# Patient Record
Sex: Female | Born: 2005 | Race: Black or African American | Hispanic: No | Marital: Single | Smoking: Never smoker
Health system: Southern US, Community
[De-identification: ages and names within clinical notes are randomized; demographics above are authoritative.]

## PROBLEM LIST (undated history)

## (undated) DIAGNOSIS — H52209 Unspecified astigmatism, unspecified eye: Secondary | ICD-10-CM

## (undated) DIAGNOSIS — K5900 Constipation, unspecified: Secondary | ICD-10-CM

## (undated) DIAGNOSIS — J309 Allergic rhinitis, unspecified: Secondary | ICD-10-CM

## (undated) DIAGNOSIS — F909 Attention-deficit hyperactivity disorder, unspecified type: Secondary | ICD-10-CM

## (undated) DIAGNOSIS — F800 Phonological disorder: Secondary | ICD-10-CM

## (undated) DIAGNOSIS — E669 Obesity, unspecified: Secondary | ICD-10-CM

## (undated) HISTORY — DX: Phonological disorder: F80.0

## (undated) HISTORY — PX: UMBILICAL HERNIA REPAIR: SHX196

## (undated) HISTORY — DX: Attention-deficit hyperactivity disorder, unspecified type: F90.9

## (undated) HISTORY — DX: Allergic rhinitis, unspecified: J30.9

## (undated) HISTORY — DX: Unspecified astigmatism, unspecified eye: H52.209

## (undated) HISTORY — DX: Constipation, unspecified: K59.00

---
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
## 2024-01-13 ENCOUNTER — Ambulatory Visit (INDEPENDENT_AMBULATORY_CARE_PROVIDER_SITE_OTHER)

## 2024-01-13 DIAGNOSIS — J309 Allergic rhinitis, unspecified: Secondary | ICD-10-CM | POA: Diagnosis not present

## 2024-02-02 ENCOUNTER — Other Ambulatory Visit: Payer: Self-pay

## 2024-02-02 ENCOUNTER — Encounter: Payer: Self-pay | Admitting: Obstetrics and Gynecology

## 2024-02-02 ENCOUNTER — Ambulatory Visit: Payer: Self-pay | Admitting: Obstetrics and Gynecology

## 2024-02-02 ENCOUNTER — Encounter: Payer: Self-pay | Admitting: Family Medicine

## 2024-02-02 ENCOUNTER — Encounter: Payer: Self-pay | Admitting: Allergy & Immunology

## 2024-02-02 ENCOUNTER — Ambulatory Visit (INDEPENDENT_AMBULATORY_CARE_PROVIDER_SITE_OTHER)

## 2024-02-02 VITALS — BP 122/85 | HR 69 | Wt 174.8 lb

## 2024-02-02 DIAGNOSIS — N910 Primary amenorrhea: Secondary | ICD-10-CM | POA: Diagnosis not present

## 2024-02-02 DIAGNOSIS — Z1331 Encounter for screening for depression: Secondary | ICD-10-CM | POA: Diagnosis not present

## 2024-02-02 DIAGNOSIS — J309 Allergic rhinitis, unspecified: Secondary | ICD-10-CM | POA: Diagnosis not present

## 2024-02-02 NOTE — Progress Notes (Signed)
 GYNECOLOGY OFFICE NOTE  History:  18 y.o. G0P0000 here today for has never had a period. Does occasionally get cramping. Occasionally has vaginal odor. Denies itching/burning/abnormal discharge. Has not had any kind of workup for amenorrhea. Not and has never been sexually active. Has never looked at groin area with mirror.  H/o FAS, ADHD.  Past Medical History:  Diagnosis Date   ADHD    Allergic rhinitis    seasonal,   Articulation disorder    Astigmatism    Constipated    Obesity     Past Surgical History:  Procedure Laterality Date   ADENOIDECTOMY  2016   TOOTH EXTRACTION N/A 03/03/2023   Procedure: DENTAL RESTORATION/EXTRACTIONS;  Surgeon: Ascencion Lava, DMD;  Location: [facility] OR;  Service: Oral Surgery;  Laterality: N/A;   UMBILICAL HERNIA REPAIR     as a baby     Current Outpatient Medications:    Azelastine -Fluticasone  (DYMISTA ) 137-50 MCG/ACT SUSP, Place 1 spray into the nose 2 (two) times daily as needed (runny or stuffy nose)., Disp: 23 g, Rfl: 5   EPINEPHrine  0.3 mg/0.3 mL IJ SOAJ injection, Inject 0.3 mg into the muscle as needed for anaphylaxis., Disp: 2 each, Rfl: 2   hydrOXYzine  (ATARAX ) 25 MG tablet, Take 25 mg by mouth at bedtime as needed., Disp: , Rfl:    levocetirizine (XYZAL ) 5 MG tablet, Take 1 tablet (5 mg total) by mouth daily as needed for allergies., Disp: 30 tablet, Rfl: 5   lisdexamfetamine (VYVANSE ) 40 MG capsule, Take 1 capsule (40 mg total) by mouth daily., Disp: 30 capsule, Rfl: 0   QELBREE 150 MG 24 hr capsule, Take 300 mg by mouth every morning., Disp: , Rfl:    lamoTRIgine  (LAMICTAL ) 25 MG tablet, Take 1 tablet (25 mg total) by mouth 2 (two) times daily. (Patient not taking: Reported on 02/02/2024), Disp: 60 tablet, Rfl: 0  The following portions of the patient's history were reviewed and updated as appropriate: allergies, current medications, past family history, past medical history, past social history, past surgical history and problem list.    Review of Systems:  Pertinent items noted in HPI and remainder of comprehensive ROS otherwise negative.   Objective:  Physical Exam BP 122/85   Pulse 69   Wt 174 lb 12.8 oz (79.3 kg)  CONSTITUTIONAL: Well-developed, well-nourished female in no acute distress.  HENT:  Normocephalic, atraumatic. External right and left ear normal. Oropharynx is clear and moist EYES: Conjunctivae and EOM are normal. Pupils are equal, round, and reactive to light. No scleral icterus.  NECK: Normal range of motion, supple, no masses SKIN: Skin is warm and dry. No rash noted. Not diaphoretic. No erythema. No pallor. NEUROLOGIC: Alert and oriented to person, place, and time. Normal reflexes, muscle tone coordination. No cranial nerve deficit noted. PSYCHIATRIC: Normal mood and affect. Normal behavior. Normal judgment and thought content. CARDIOVASCULAR: Normal heart rate noted BREAST: normal bilateral breast development, Tanner stage 5 RESPIRATORY: Effort normal, no problems with respiration noted ABDOMEN: Soft, no distention noted.   PELVIC: appearance of normal groin hair distribution though it is shaven, tanner stage 4 or 5, external female genitalia present, labia minora small, consistent with teenager, mildly enlarged clitoris, approx 0.5 cm wide and 1.5 cm deep, normal appearing introitus, patient declined internal exam MUSCULOSKELETAL: Normal range of motion. No edema noted.  Exam done with chaperone present.  Labs and Imaging No results found.  Assessment & Plan:   1. Amenorrhea, primary (Primary) - reviewed findings, (eg normal breast  development, possible clitoromegaly although may be within larger end of normal range, normal hair development/distribution) -reviewed there are multiple potential possibilities including hormonal imbalance, enzyme deficiency, structural issues, need for workup to start with most common and rule out, then will proceed pending results - pt verbalizes understanding  of the above - Beta hCG quant (ref lab) - FSH - Prolactin - TSH Rfx on Abnormal to Free T4 - US  PELVIS (TRANSABDOMINAL ONLY); Future   Routine preventative health maintenance measures emphasized. Please refer to After Visit Summary for other counseling recommendations.   Return in about 1 month (around 03/04/2024).   Larence Pleas, MD, [facility] Attending Center for [facility])

## 2024-02-03 LAB — FOLLICLE STIMULATING HORMONE: FSH: 116 m[IU]/mL

## 2024-02-03 LAB — TSH RFX ON ABNORMAL TO FREE T4: TSH: 1.73 u[IU]/mL (ref 0.450–4.500)

## 2024-02-03 LAB — BETA HCG QUANT (REF LAB): hCG Quant: <1 m[IU]/mL

## 2024-02-03 LAB — PROLACTIN: Prolactin: 6.4 ng/mL (ref 4.8–33.4)

## 2024-02-05 ENCOUNTER — Ambulatory Visit: Payer: Self-pay | Admitting: Family Medicine

## 2024-02-10 ENCOUNTER — Ambulatory Visit (HOSPITAL_COMMUNITY): Admission: RE | Admit: 2024-02-10 | Source: Ambulatory Visit

## 2024-02-17 ENCOUNTER — Other Ambulatory Visit: Payer: Self-pay | Admitting: *Deleted

## 2024-02-17 ENCOUNTER — Encounter: Payer: Self-pay | Admitting: *Deleted

## 2024-02-17 DIAGNOSIS — N910 Primary amenorrhea: Secondary | ICD-10-CM

## 2024-02-17 DIAGNOSIS — R7989 Other specified abnormal findings of blood chemistry: Secondary | ICD-10-CM

## 2024-02-22 ENCOUNTER — Other Ambulatory Visit: Payer: Self-pay

## 2024-02-22 ENCOUNTER — Ambulatory Visit (HOSPITAL_COMMUNITY)
Admission: RE | Admit: 2024-02-22 | Discharge: 2024-02-22 | Disposition: A | Discharge status: Home / Self Care | Source: Ambulatory Visit | Attending: Obstetrics and Gynecology | Admitting: Obstetrics and Gynecology

## 2024-02-22 DIAGNOSIS — N910 Primary amenorrhea: Secondary | ICD-10-CM

## 2024-02-22 DIAGNOSIS — R7989 Other specified abnormal findings of blood chemistry: Secondary | ICD-10-CM

## 2024-02-26 ENCOUNTER — Ambulatory Visit (INDEPENDENT_AMBULATORY_CARE_PROVIDER_SITE_OTHER): Payer: Self-pay

## 2024-02-26 DIAGNOSIS — J309 Allergic rhinitis, unspecified: Secondary | ICD-10-CM | POA: Diagnosis not present

## 2024-03-03 ENCOUNTER — Ambulatory Visit (INDEPENDENT_AMBULATORY_CARE_PROVIDER_SITE_OTHER): Payer: Self-pay

## 2024-03-03 DIAGNOSIS — J309 Allergic rhinitis, unspecified: Secondary | ICD-10-CM | POA: Diagnosis not present

## 2024-03-09 ENCOUNTER — Encounter: Payer: Self-pay | Admitting: Allergy

## 2024-03-09 ENCOUNTER — Ambulatory Visit (INDEPENDENT_AMBULATORY_CARE_PROVIDER_SITE_OTHER): Payer: Medicaid Other | Admitting: Allergy

## 2024-03-09 DIAGNOSIS — J3089 Other allergic rhinitis: Secondary | ICD-10-CM | POA: Diagnosis not present

## 2024-03-09 NOTE — Progress Notes (Signed)
 Follow-up Note  RE: Erin Wright MRN: [id number] DOB: [birth year] Date of Office Visit: 03/09/2024   History of present illness: Erin Wright is a 18 y.o. female presenting today skin testing visit.  She was last seen in the office on 09/06/24 for allergic rhinitis.  She is in her usual state of health today without recent illness.  She has held antihistamines for at least 3 days for testing today.   Medication List: Current Outpatient Medications  Medication Sig Dispense Refill   Azelastine -Fluticasone  (DYMISTA ) 137-50 MCG/ACT SUSP Place 1 spray into the nose 2 (two) times daily as needed (runny or stuffy nose). 23 g 5   EPINEPHrine  0.3 mg/0.3 mL IJ SOAJ injection Inject 0.3 mg into the muscle as needed for anaphylaxis. 2 each 2   hydrOXYzine  (ATARAX ) 25 MG tablet Take 25 mg by mouth at bedtime as needed.     lamoTRIgine  (LAMICTAL ) 25 MG tablet Take 1 tablet (25 mg total) by mouth 2 (two) times daily. (Patient not taking: Reported on 02/02/2024) 60 tablet 0   levocetirizine (XYZAL ) 5 MG tablet Take 1 tablet (5 mg total) by mouth daily as needed for allergies. 30 tablet 5   lisdexamfetamine (VYVANSE ) 40 MG capsule Take 1 capsule (40 mg total) by mouth daily. 30 capsule 0   QELBREE 150 MG 24 hr capsule Take 300 mg by mouth every morning.     No current facility-administered medications for this visit.     Known medication allergies: No Known Allergies  Diagnostics/Labs:  Allergy  testing:   Airborne Adult Perc - 03/09/24 1453     Time Antigen Placed 1453    Allergen Manufacturer Jestine    Location Back    Number of Test 55    Panel 1 Select    1. Control-Buffer 50% Glycerol Negative    2. Control-Histamine 2+    3. Bahia Negative    4. [place] Territ[place] Negative    5. Johnson Negative    6. [place]  Blue Negative    7. Meadow Fescue 2+    8. Perennial Rye Negative    9. Timothy Negative    10. Ragweed Mix Negative    11. Cocklebur 2+    12. Plantain,  English Negative     13. Baccharis Negative    14. Dog Fennel Negative    15. [place] Thistle 2+    16. Lamb's Quarters Negative    17. Sheep Sorrell Negative    18. Rough Pigweed Negative    19. Marsh Elder, Rough 2+    20. Mugwort, Common Negative    21. Box, Elder Negative    22. Cedar, red Negative    23. Sweet Gum 2+    24. Pecan Pollen 2+    25. Pine Mix Negative    26. Walnut, Black Pollen Negative    27. Red Mulberry 2+    28. Ash Mix 2+    29. Birch Mix Negative    30. Beech American Negative    31. Cottonwood, [place] Negative    32. Hickory, White 3+    33. Maple Mix Negative    34. Oak, [place] Mix Negative    35. Sycamore Eastern Negative    36. Alternaria Alternata Negative    37. Cladosporium Herbarum Negative    38. Aspergillus Mix Negative    39. Penicillium Mix Negative    40. Bipolaris Sorokiniana (Helminthosporium) Negative    41. Drechslera Spicifera (Curvularia) Negative    42. Mucor Plumbeus Negative  43. Fusarium Moniliforme Negative    44. Aureobasidium Pullulans (pullulara) Negative    45. Rhizopus Oryzae Negative    46. Botrytis Cinera Negative    47. Epicoccum Nigrum Negative    48. Phoma Betae Negative    49. Dust Mite Mix Negative    50. Cat Hair 10,000 BAU/ml Negative    51.  Dog Epithelia Negative    52. Mixed Feathers Negative    53. Horse Epithelia Negative    54. Cockroach, [place] Negative          Allergy  testing results were read and interpreted by provider, documented by clinical staff.   Assessment and plan:   Allergic rhinitis   - Continue avoidance measures for dust mites, cat, dog, grasses, trees, weeds, molds, cockroach.       - Rotate every 6 months between Cetirizine  and Levocetirizine.     Make sure to take at least 30 minutes prior to your allergy  shots.     - for nasal congestion (stuffy nose) or drainage (runny nose) use Dymista  spray 1 spray each nostril twice a day as needed.   - allergen immunotherapy (allergy  shots) at 4  week maintenance dosing.  Bring your Epipen  on your allergy  shot days.  - Testing today showed: grasses, weeds, and trees.  These levels are much lower/smaller than they were in 2019 testing.  You have lost reactivity to molds, dust mites, cat, dog and cockroach!  Thus you no longer need shots from the second vial containing molds, dust mites and cockroach thus will discontinue this vial.  Will plan to continue your current pollen/pet vial and once it has run out will remake with just pollens only to complete and then will be done with allergy  shots! - Copy of test results provided.  - Avoidance measures provided.  Follow-up 6 months and plan to skin test at this visit   I appreciate the opportunity to take part in Erin Wright's care. Please do not hesitate to contact me with questions.  Sincerely,   Danita Brain, MD Allergy /Immunology [facility]

## 2024-03-09 NOTE — Patient Instructions (Addendum)
 Allergic rhinitis   - Continue avoidance measures for dust mites, cat, dog, grasses, trees, weeds, molds, cockroach.       - Rotate every 6 months between Cetirizine  and Levocetirizine.     Make sure to take at least 30 minutes prior to your allergy  shots.     - for nasal congestion (stuffy nose) or drainage (runny nose) use Dymista  spray 1 spray each nostril twice a day as needed.   - allergen immunotherapy (allergy  shots) at 4 week maintenance dosing.  Bring your Epipen  on your allergy  shot days.  - Testing today showed: grasses, weeds, and trees.  These levels are much lower/smaller than they were in 2019 testing.  You have lost reactivity to molds, dust mites, cat, dog and cockroach!  Thus you no longer need shots from the second vial containing molds, dust mites and cockroach thus will discontinue this vial.  Will plan to continue your current pollen/pet vial and once it has run out will remake with just pollens only to complete and then will be done with allergy  shots! - Copy of test results provided.  - Avoidance measures provided.  Follow-up 6 months and plan to skin test at this visit

## 2024-03-10 LAB — CHROMOSOME, BLOOD, ROUTINE
Cells Analyzed: 20
Cells Counted: 30
Cells Karyotyped: 2
GTG Band Resolution Achieved: 500

## 2024-03-10 NOTE — Addendum Note (Signed)
 Addended by: MARCINE ISAIAH CROME on: 03/10/2024 11:31 AM   Modules accepted: Orders

## 2024-03-11 ENCOUNTER — Ambulatory Visit: Payer: Self-pay | Admitting: Obstetrics and Gynecology

## 2024-03-22 ENCOUNTER — Ambulatory Visit (INDEPENDENT_AMBULATORY_CARE_PROVIDER_SITE_OTHER)

## 2024-03-22 DIAGNOSIS — J309 Allergic rhinitis, unspecified: Secondary | ICD-10-CM | POA: Diagnosis not present

## 2024-03-23 ENCOUNTER — Encounter: Payer: Self-pay | Admitting: Obstetrics and Gynecology

## 2024-03-23 ENCOUNTER — Other Ambulatory Visit: Payer: Self-pay

## 2024-03-23 ENCOUNTER — Ambulatory Visit (INDEPENDENT_AMBULATORY_CARE_PROVIDER_SITE_OTHER): Payer: Self-pay | Admitting: Obstetrics and Gynecology

## 2024-03-23 VITALS — BP 127/89 | HR 56 | Wt 187.0 lb

## 2024-03-23 DIAGNOSIS — E2839 Other primary ovarian failure: Secondary | ICD-10-CM

## 2024-03-23 DIAGNOSIS — Z1331 Encounter for screening for depression: Secondary | ICD-10-CM | POA: Diagnosis not present

## 2024-03-23 MED ORDER — NORETHIN ACE-ETH ESTRAD-FE 1-20 MG-MCG(24) PO TABS: 1.0000 | ORAL_TABLET | Freq: Every day | ORAL | 11 refills | Status: DC

## 2024-03-23 NOTE — Progress Notes (Signed)
 GYNECOLOGY OFFICE FOLLOW UP NOTE  History:  18 y.o. G0P0000 here today for follow up for primary amenorrhea. Patient continues to have no periods. Here with mom. 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    Past Medical History:  Diagnosis Date   ADHD    Allergic rhinitis    seasonal,   Articulation disorder    Astigmatism    Constipated    Obesity     Past Surgical History:  Procedure Laterality Date   ADENOIDECTOMY  2016   TOOTH EXTRACTION N/A 03/03/2023   Procedure: DENTAL RESTORATION/EXTRACTIONS;  Surgeon: Sheryle Hamilton, DMD;  Location: [facility] OR;  Service: Oral Surgery;  Laterality: N/A;   UMBILICAL HERNIA REPAIR     as a baby     Current Outpatient Medications:    Azelastine -Fluticasone  (DYMISTA ) 137-50 MCG/ACT SUSP, Place 1 spray into the nose 2 (two) times daily as needed (runny or stuffy nose)., Disp: 23 g, Rfl: 5   hydrOXYzine  (ATARAX ) 25 MG tablet, Take 25 mg by mouth at bedtime as needed., Disp: , Rfl:    lamoTRIgine  (LAMICTAL ) 25 MG tablet, Take 1 tablet (25 mg total) by mouth 2 (two) times daily., Disp: 60 tablet, Rfl: 0   levocetirizine (XYZAL ) 5 MG tablet, Take 1 tablet (5 mg total) by mouth daily as needed for allergies., Disp: 30 tablet, Rfl: 5   lisdexamfetamine (VYVANSE ) 40 MG capsule, Take 1 capsule (40 mg total) by mouth daily., Disp: 30 capsule, Rfl: 0   Norethindrone Acetate-Ethinyl Estrad-FE (LOESTRIN 24 FE) 1-20 MG-MCG(24) tablet, Take 1 tablet by mouth daily., Disp: 28 tablet, Rfl: 11   QELBREE 150 MG 24 hr capsule, Take 300 mg by mouth every morning., Disp: , Rfl:    EPINEPHrine  0.3 mg/0.3 mL IJ SOAJ injection, Inject 0.3 mg into the muscle as needed for anaphylaxis. (Patient not taking: Reported on 03/23/2024), Disp: 2 each, Rfl: 2  The following portions of the patient's history were reviewed and updated as appropriate: allergies, current medications, past family history, past medical history, past social history, past surgical history and problem list.    Review of Systems:  Pertinent items noted in HPI and remainder of comprehensive ROS otherwise negative.   Objective:  Physical Exam BP 127/89   Pulse (!) 56   Wt 187 lb (84.8 kg)  CONSTITUTIONAL: Well-developed, well-nourished female in no acute distress.  HENT:  Normocephalic, atraumatic. External right and left ear normal. Oropharynx is clear and moist EYES: Conjunctivae and EOM are normal. Pupils are equal, round, and reactive to light. No scleral icterus.  NECK: Normal range of motion, supple, no masses SKIN: Skin is warm and dry. No rash noted. Not diaphoretic. No erythema. No pallor. NEUROLOGIC: Alert and oriented to person, place, and time. Normal reflexes, muscle tone coordination. No cranial nerve deficit noted. PSYCHIATRIC: Normal mood and affect. Normal behavior. Normal judgment and thought content. CARDIOVASCULAR: Normal heart rate noted RESPIRATORY: Effort normal, no problems with respiration noted ABDOMEN: Soft, no distention noted.   PELVIC: deferred MUSCULOSKELETAL: Normal range of motion. No edema noted.  Labs and Imaging US  PELVIS (TRANSABDOMINAL ONLY) Result Date: 02/22/2024 CLINICAL DATA:  Primary amenorrhea EXAM: TRANSABDOMINAL ULTRASOUND OF PELVIS TECHNIQUE: Transabdominal ultrasound examination of the pelvis was performed including evaluation of the uterus, ovaries, adnexal regions, and pelvic cul-de-sac. COMPARISON:  None Available. FINDINGS: Uterus Measurements: 5.0 cm in sagittal dimension. No fibroids or other mass visualized. Endometrium Endometrium is not well seen. Right ovary Not seen. Left ovary Not seen. Other findings:  No abnormal free  fluid. IMPRESSION: 1. Limited transabdominal pelvic ultrasound examination due to suboptimal bladder distension. Endometrium and ovaries are not well seen. 2. Uterine length in sagittal dimension measures at the lower limits of normal. Electronically Signed   By: Limin  Xu M.D.   On: 02/22/2024 16:17    Assessment & Plan:   1. Primary ovarian insufficiency (Primary) - Reviewed findings of testing thus far - 59 XX karyotype, pelvic US  with uterus noted though not well visualized, ovaries not visualized - dx with primary ovarian insufficiency, reviewed etiology and course, management and prognosis, reviewed low likelihood of achieving pregnancy though 5-10% of people will be able to get pregnant and ovarian function not certain and potentially variable, reviewed further testing warranted to try to ascertain cause for POI but may not find etiology, reviewed need for hormonal management for protection of bones/CV, prevent osteoporosis and CVD as well as breast cancer - reviewed options for hormone replacement management and contraception if/when needed (she is not currently sexually active). Mom is interested in an IUD for her, however reviewed with patient and she is reluctant at this time, will plan to start OCPs for hormonal management/replacement at this time and consider IUD at some point in future, patient agreeable to this plan - reviewed importance of lifelong management needed - answered all questions, patient and mom verbalize they understand dx and plan - FSH - Estradiol - 21-Hydroxylase Antibodies - Fragile X Syndrome, Diagnostic - Cortisol-am, blood - DHEA-sulfate - Thyroid  peroxidase antibody - 17-Hydroxyprogesterone - ANTI-ADRENAL AB BY INDIRECT FLUORESCENT ANTIBODY(IFA)(RDL)   Routine preventative health maintenance measures emphasized. Please refer to After Visit Summary for other counseling recommendations.   Return in about 3 months (around 06/23/2024) for Followup.   LOIS Yolanda Moats, MD, [facility] Attending Center for [employer] [facility])

## 2024-03-25 LAB — 17-HYDROXYPROGESTERONE: 17-OH Progesterone LCMS: 12 ng/dL

## 2024-04-02 LAB — 21-HYDROXYLASE ANTIBODIES: 21-Hydroxylase Antibodies: NEGATIVE

## 2024-04-05 ENCOUNTER — Ambulatory Visit: Payer: Self-pay | Admitting: Obstetrics and Gynecology

## 2024-04-05 LAB — CORTISOL-AM, BLOOD: Cortisol - AM: 8.1 ug/dL (ref 6.2–19.4)

## 2024-04-05 LAB — FRAGILE X SYNDROME, DIAGNOSTIC

## 2024-04-05 LAB — FOLLICLE STIMULATING HORMONE: FSH: 99.9 m[IU]/mL

## 2024-04-05 LAB — ESTRADIOL: Estradiol: <5 pg/mL

## 2024-04-05 LAB — DHEA-SULFATE: DHEA-SO4: 323 ug/dL (ref 110.0–433.2)

## 2024-04-05 LAB — THYROID PEROXIDASE ANTIBODY: Thyroperoxidase Ab SerPl-aCnc: 123 [IU]/mL — ABNORMAL HIGH (ref 0–26)

## 2024-05-23 ENCOUNTER — Other Ambulatory Visit (HOSPITAL_COMMUNITY): Payer: Self-pay

## 2024-05-31 ENCOUNTER — Ambulatory Visit: Payer: Self-pay | Admitting: Obstetrics and Gynecology

## 2024-05-31 ENCOUNTER — Encounter: Payer: Self-pay | Admitting: Obstetrics and Gynecology

## 2024-05-31 ENCOUNTER — Other Ambulatory Visit: Payer: Self-pay

## 2024-05-31 VITALS — BP 138/92 | HR 52 | Wt 200.0 lb

## 2024-05-31 DIAGNOSIS — E2839 Other primary ovarian failure: Secondary | ICD-10-CM

## 2024-05-31 DIAGNOSIS — N910 Primary amenorrhea: Secondary | ICD-10-CM | POA: Diagnosis not present

## 2024-05-31 DIAGNOSIS — Z1331 Encounter for screening for depression: Secondary | ICD-10-CM | POA: Diagnosis not present

## 2024-05-31 MED ORDER — NORETHIN ACE-ETH ESTRAD-FE 1-20 MG-MCG(24) PO TABS: 1.0000 | ORAL_TABLET | Freq: Every day | ORAL | 11 refills | Status: AC

## 2024-05-31 NOTE — Progress Notes (Signed)
 GYNECOLOGY OFFICE FOLLOW UP NOTE  History:  18 y.o. G0P0000 here today for follow up for primary amenorrhea. Patient continues to have no periods. Has been pretty good about taking her OCPs, states she rarely misses them. Not sexually active. No questions about diagnosis. Has gained some weight but states that happens when she doesn't take her ADHD pills.   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     Past Medical History:  Diagnosis Date   ADHD    Allergic rhinitis    seasonal,   Articulation disorder    Astigmatism    Constipated    Obesity     Past Surgical History:  Procedure Laterality Date   ADENOIDECTOMY  2016   TOOTH EXTRACTION N/A 03/03/2023   Procedure: DENTAL RESTORATION/EXTRACTIONS;  Surgeon: Sheryle Hamilton, DMD;  Location: [facility] OR;  Service: Oral Surgery;  Laterality: N/A;   UMBILICAL HERNIA REPAIR     as a baby     Current Outpatient Medications:    hydrOXYzine  (ATARAX ) 25 MG tablet, Take 25 mg by mouth at bedtime as needed., Disp: , Rfl:    lamoTRIgine  (LAMICTAL ) 25 MG tablet, Take 1 tablet (25 mg total) by mouth 2 (two) times daily., Disp: 60 tablet, Rfl: 0   levocetirizine (XYZAL ) 5 MG tablet, Take 1 tablet (5 mg total) by mouth daily as needed for allergies., Disp: 30 tablet, Rfl: 5   QELBREE 150 MG 24 hr capsule, Take 300 mg by mouth every morning., Disp: , Rfl:    Azelastine -Fluticasone  (DYMISTA ) 137-50 MCG/ACT SUSP, Place 1 spray into the nose 2 (two) times daily as needed (runny or stuffy nose)., Disp: 23 g, Rfl: 5   EPINEPHrine  0.3 mg/0.3 mL IJ SOAJ injection, Inject 0.3 mg into the muscle as needed for anaphylaxis. (Patient not taking: Reported on 03/23/2024), Disp: 2 each, Rfl: 2   lisdexamfetamine (VYVANSE ) 40 MG capsule, Take 1 capsule (40 mg total) by mouth daily., Disp: 30 capsule, Rfl: 0   Norethindrone Acetate-Ethinyl Estrad-FE (LOESTRIN 24 FE) 1-20 MG-MCG(24) tablet, Take 1 tablet by mouth daily., Disp: 28 tablet, Rfl: 11  The following portions of the  patient's history were reviewed and updated as appropriate: allergies, current medications, past family history, past medical history, past social history, past surgical history and problem list.   Review of Systems:  Pertinent items noted in HPI and remainder of comprehensive ROS otherwise negative.   Objective:  Physical Exam BP (!) 138/92   Pulse (!) 52   Wt 200 lb (90.7 kg)  CONSTITUTIONAL: Well-developed, well-nourished female in no acute distress.  HENT:  Normocephalic, atraumatic. External right and left ear normal. Oropharynx is clear and moist EYES: Conjunctivae and EOM are normal. Pupils are equal, round, and reactive to light. No scleral icterus.  NECK: Normal range of motion, supple, no masses SKIN: Skin is warm and dry. No rash noted. Not diaphoretic. No erythema. No pallor. NEUROLOGIC: Alert and oriented to person, place, and time. Normal reflexes, muscle tone coordination. No cranial nerve deficit noted. PSYCHIATRIC: Normal mood and affect. Normal behavior. Normal judgment and thought content. CARDIOVASCULAR: Normal heart rate noted RESPIRATORY: Effort normal, no problems with respiration noted ABDOMEN: Soft, no distention noted.   PELVIC: deferred MUSCULOSKELETAL: Normal range of motion. No edema noted.  Labs and Imaging No results found.  Assessment & Plan:   1. Primary ovarian insufficiency (Primary) Remains on OCPs Reviewed importance of continuing to take OCPs for hormonal benefit, if she is missing pills regularly and sexually active, then may need  to discuss alternative method of contraception - reviewed diagnosis, she verbalizes understanding of plan and has no questions  2. Amenorrhea, primary  F/u with PCP for elevated BP at her yearly appt   Routine preventative health maintenance measures emphasized. Please refer to After Visit Summary for other counseling recommendations.   Return in about 6 months (around 11/28/2024) for Followup.   LOIS Yolanda Moats, MD, [facility] Attending Center for [employer] [facility])

## 2024-08-31 ENCOUNTER — Ambulatory Visit: Admitting: Allergy

## 2024-09-18 ENCOUNTER — Encounter: Payer: Self-pay | Admitting: Emergency Medicine

## 2024-09-18 ENCOUNTER — Emergency Department (HOSPITAL_COMMUNITY): Payer: MEDICAID

## 2024-09-18 ENCOUNTER — Encounter (HOSPITAL_COMMUNITY): Payer: Self-pay | Admitting: Pharmacy Technician

## 2024-09-18 ENCOUNTER — Emergency Department (HOSPITAL_COMMUNITY)
Admission: EM | Admit: 2024-09-18 | Discharge: 2024-09-18 | Disposition: A | Discharge status: Home / Self Care | Payer: MEDICAID | Attending: Emergency Medicine | Admitting: Emergency Medicine

## 2024-09-18 ENCOUNTER — Ambulatory Visit
Admission: EM | Admit: 2024-09-18 | Discharge: 2024-09-18 | Disposition: A | Discharge status: Other Acute Inpatient Hospital | Payer: MEDICAID | Attending: Student | Admitting: Student

## 2024-09-18 ENCOUNTER — Other Ambulatory Visit: Payer: Self-pay

## 2024-09-18 DIAGNOSIS — R1012 Left upper quadrant pain: Secondary | ICD-10-CM | POA: Diagnosis present

## 2024-09-18 DIAGNOSIS — R1011 Right upper quadrant pain: Secondary | ICD-10-CM | POA: Insufficient documentation

## 2024-09-18 DIAGNOSIS — R1085 Abdominal pain of multiple sites: Secondary | ICD-10-CM

## 2024-09-18 DIAGNOSIS — R399 Unspecified symptoms and signs involving the genitourinary system: Secondary | ICD-10-CM

## 2024-09-18 DIAGNOSIS — R519 Headache, unspecified: Secondary | ICD-10-CM | POA: Diagnosis not present

## 2024-09-18 DIAGNOSIS — R109 Unspecified abdominal pain: Secondary | ICD-10-CM

## 2024-09-18 LAB — COMPREHENSIVE METABOLIC PANEL WITH GFR
ALT: 25 U/L (ref 0–44)
AST: 25 U/L (ref 15–41)
Albumin: 5.2 g/dL — ABNORMAL HIGH (ref 3.5–5.0)
Alkaline Phosphatase: 104 U/L (ref 38–126)
Anion gap: 14 (ref 5–15)
BUN: 13 mg/dL (ref 6–20)
CO2: 22 mmol/L (ref 22–32)
Calcium: 10.7 mg/dL — ABNORMAL HIGH (ref 8.9–10.3)
Chloride: 103 mmol/L (ref 98–111)
Creatinine, Ser: 0.96 mg/dL (ref 0.44–1.00)
GFR, Estimated: >60 mL/min
Glucose, Bld: 90 mg/dL (ref 70–99)
Potassium: 4.4 mmol/L (ref 3.5–5.1)
Sodium: 139 mmol/L (ref 135–145)
Total Bilirubin: 0.5 mg/dL (ref 0.0–1.2)
Total Protein: 9.4 g/dL — ABNORMAL HIGH (ref 6.5–8.1)

## 2024-09-18 LAB — CBC
HCT: 43.9 % (ref 36.0–46.0)
Hemoglobin: 14.1 g/dL (ref 12.0–15.0)
MCH: 28.4 pg (ref 26.0–34.0)
MCHC: 32.1 g/dL (ref 30.0–36.0)
MCV: 88.5 fL (ref 80.0–100.0)
Platelets: 410 K/uL — ABNORMAL HIGH (ref 150–400)
RBC: 4.96 MIL/uL (ref 3.87–5.11)
RDW: 12.4 % (ref 11.5–15.5)
WBC: 5.7 K/uL (ref 4.0–10.5)
nRBC: 0 % (ref 0.0–0.2)

## 2024-09-18 LAB — LIPASE, BLOOD: Lipase: 20 U/L (ref 11–51)

## 2024-09-18 LAB — POCT URINE DIPSTICK
Blood, UA: NEGATIVE
Glucose, UA: NEGATIVE mg/dL
Leukocytes, UA: NEGATIVE
Nitrite, UA: NEGATIVE
POC PROTEIN,UA: 30 — AB
Spec Grav, UA: >=1.03 — AB
Urobilinogen, UA: 0.2 U/dL
pH, UA: 6

## 2024-09-18 LAB — POCT URINE PREGNANCY: Preg Test, Ur: NEGATIVE

## 2024-09-18 LAB — HCG, SERUM, QUALITATIVE: Preg, Serum: NEGATIVE

## 2024-09-18 MED ORDER — IOHEXOL 300 MG/ML  SOLN
100.0000 mL | Freq: Once | INTRAMUSCULAR | Status: AC | PRN
  Administered 2024-09-18: 100 mL via INTRAVENOUS

## 2024-09-18 MED ORDER — FENTANYL CITRATE (PF) 50 MCG/ML IJ SOSY
50.0000 ug | PREFILLED_SYRINGE | Freq: Once | INTRAMUSCULAR | Status: AC
  Administered 2024-09-18: 50 ug via INTRAVENOUS
  Filled 2024-09-18: qty 1

## 2024-09-18 NOTE — Discharge Instructions (Signed)
-  Please head to the emergency department for further evaluation of your abdominal pain. -I do not know what is causing your pain, and because this is an urgent care, I cannot do a scan of your belly to figure out the cause. -Please head straight to the ER, in the car driven by your sister.

## 2024-09-18 NOTE — ED Provider Notes (Signed)
 " [facility]    CSN: [id number] Arrival date & time: 09/18/24  1148      History   Chief Complaint Chief Complaint  Patient presents with   Abdominal Pain    HPI Erin Wright is a 19 y.o. female presenting w abd pain and flank pain.  -Pt sts generalized abd pain worse in the epigastric area, left upper, into flank x 5 days. States her stomach is bruised.  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   Receptive language disorder 01/13/2017   ADHD (attention deficit hyperactivity disorder), combined type 01/13/2017   ADHD (attention deficit hyperactivity disorder) 06/06/2013   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 Authorizing Provider  Azelastine -Fluticasone  (DYMISTA ) 137-50 MCG/ACT SUSP Place 1 spray into the nose 2 (two) times daily as needed (runny or stuffy nose). 09/07/23   Jeneal Danita Macintosh, MD   EPINEPHrine  0.3 mg/0.3 mL IJ SOAJ injection Inject 0.3 mg into the muscle as needed for anaphylaxis. Patient not taking: Reported on 03/23/2024 02/25/23   Jeneal Danita Macintosh, MD  hydrOXYzine  (ATARAX ) 25 MG tablet Take 25 mg by mouth at bedtime as needed. 08/11/23   [provider]  lamoTRIgine  (LAMICTAL ) 25 MG tablet Take 1 tablet (25 mg total) by mouth 2 (two) times daily. 06/11/23   Carrion-Carrero, Marlo, MD  levocetirizine (XYZAL ) 5 MG tablet Take 1 tablet (5 mg total) by mouth daily as needed for allergies. 09/07/23   Jeneal Danita Macintosh, MD  lisdexamfetamine  (VYVANSE ) 40 MG capsule Take 1 capsule (40 mg total) by mouth daily. 06/12/23 03/23/24  Carrion-Carrero, Marlo, MD  Norethindrone Acetate-Ethinyl Estrad-FE (LOESTRIN 24 FE) 1-20 MG-MCG(24) tablet Take 1 tablet by mouth daily. 05/31/24   Nicholaus Burnard HERO, MD  QELBREE 150 MG 24 hr capsule Take 300 mg by mouth every morning. 12/30/23   [provider]    Family History Family History  Adopted: Yes  Problem Relation Age of Onset   Diabetes Maternal Grandmother    Cancer Maternal Great-grandmother     Social History Social History[1]   Allergies   Patient has no known  allergies.   Review of Systems Review of Systems  Constitutional:  Negative for appetite change, chills, diaphoresis and fever.  Respiratory:  Negative for shortness of breath.   Cardiovascular:  Negative for chest pain.  Gastrointestinal:  Positive for abdominal pain. Negative for blood in stool, constipation, diarrhea, nausea and vomiting.  Genitourinary:  Negative for decreased urine volume, difficulty urinating, dysuria, flank pain, frequency, genital sores, hematuria and urgency.  Musculoskeletal:  Negative for back pain.  Neurological:  Negative for dizziness, weakness and light-headedness.  All other systems reviewed and are negative.    Physical Exam Triage Vital Signs ED Triage Vitals [09/18/24 1325]  Encounter Vitals  Group     BP (!) 149/97     Girls Systolic BP Percentile      Girls Diastolic BP Percentile      Boys Systolic BP Percentile      Boys Diastolic BP Percentile      Pulse Rate 85     Resp 18     Temp 99.1 F (37.3 C)     Temp Source Oral     SpO2 98 %     Weight      Height      Head Circumference      Peak Flow      Pain Score 7     Pain Loc      Pain Education      Exclude from Growth Chart    No data found.  Updated Vital Signs BP (!) 149/97 (BP Location: Right Arm)   Pulse 85   Temp 99.1 F (37.3 C) (Oral)   Resp 18   SpO2 98%   Visual Acuity Right Eye Distance:   Left Eye Distance:   Bilateral Distance:    Right Eye Near:   Left Eye Near:    Bilateral Near:     Physical Exam Vitals reviewed.  Constitutional:      General: She is not in acute distress.    Appearance: Normal appearance. She is not ill-appearing.  HENT:     Head: Normocephalic and atraumatic.     Mouth/Throat:     Mouth: Mucous membranes are moist.     Comments: Moist mucous membranes Eyes:     Extraocular Movements: Extraocular movements intact.     Pupils: Pupils are equal, round, and reactive to light.  Cardiovascular:     Rate and Rhythm: Normal rate and regular rhythm.     Heart sounds: Normal heart sounds.  Pulmonary:     Effort: Pulmonary effort is normal.     Breath sounds: Normal breath sounds. No wheezing, rhonchi or rales.  Abdominal:     General: Bowel sounds are normal. There is no distension.     Palpations: Abdomen is soft. There is no mass.     Tenderness: There is abdominal tenderness. There is no right CVA tenderness, left CVA tenderness, guarding or rebound. Negative signs include Murphy's sign and McBurney's sign.     Comments: Abdomen is diffusely tender to palpation, without guarding or rebound.  No point tenderness.  Bowel sounds positive throughout.  Skin:    General: Skin is warm.     Capillary Refill: Capillary refill takes less than 2 seconds.      Comments: Good skin turgor  Neurological:     General: No focal deficit present.     Mental Status: She is alert and oriented to person, place, and time.  Psychiatric:        Mood  and Affect: Mood normal.        Behavior: Behavior normal.      UC Treatments / Results  Labs (all labs ordered are listed, but only abnormal results are displayed) Labs Reviewed  POCT URINE DIPSTICK - Abnormal; Notable for the following components:      Result Value   Color, UA straw (*)    Bilirubin, UA small (*)    Ketones, POC UA moderate (40) (*)    Spec Grav, UA >=1.030 (*)    POC PROTEIN,UA =30 (*)    All other components within normal limits  POCT URINE PREGNANCY    EKG   Radiology No results found.  Procedures Procedures (including critical care time)  Medications Ordered in UC Medications - No data to display  Initial Impression / Assessment and Plan / UC Course  I have reviewed the triage vital signs and the nursing notes.  Pertinent labs & imaging results that were available during my care of the patient were reviewed by me and considered in my medical decision making (see chart for details).     Patient is a pleasant 19 y.o. female presenting with abdominal pain. The patient is afebrile and nontachycardic.  Antipyretic has not been administered today. OCP contraception.  UA: Negative blood, negative leuk, negative nitrite. Urine pregnancy: Negative.  The patient has never had a menstrual cycle.  DDx is extensive, given generalized abdominal pain.  We discussed that I am unable to perform abdominal imaging in the urgent care setting, and so I recommended evaluation in the emergency department.  She is stable for transport in POV driven by sister.  Final Clinical Impressions(s) / UC Diagnoses   Final diagnoses:  Urinary symptom or sign  Abdominal pain of multiple sites     Discharge Instructions      -Please head to the emergency department for further evaluation of  your abdominal pain. -I do not know what is causing your pain, and because this is an urgent care, I cannot do a scan of your belly to figure out the cause. -Please head straight to the ER, in the car driven by your sister.     ED Prescriptions   None    PDMP not reviewed this encounter.     [1]  Social History Tobacco Use   Smoking status: Never   Smokeless tobacco: Never  Vaping Use   Vaping status: Never Used  Substance Use Topics   Alcohol use: Never   Drug use: Never     Arlyss Leita BRAVO, PA-C 09/18/24 1454  "

## 2024-09-18 NOTE — ED Triage Notes (Signed)
 Pt here from UC for abdominal pain and headache.

## 2024-09-18 NOTE — ED Provider Notes (Signed)
 " [place] [facility] AT [facility] Provider Note   CSN: [id number] Arrival date & time: 09/18/24  1446     Patient presents with: Abdominal Pain and Headache   Erin Wright is a 19 y.o. female.   Patient to ED for evaluation of abdominal pain that started about 5 days ago with sudden sharp pain in the left mid-abdomen without injury or fall. Since that time, pain extends from left mid-abdomen to LUQ and RUQ abdomen. She reports it causes increased pain to take a deep breath but no cough, or shortness of breath. No fever. She is moving her bowels per usual habit. No nausea or vomiting. She was seen at Urgent Care and advised to come to the ED.   The history is provided by the patient. No language interpreter was used.  Abdominal Pain Headache Associated symptoms: abdominal pain        Prior to Admission medications  Medication Sig Start Date End Date Taking? Authorizing Provider  Azelastine -Fluticasone  (DYMISTA ) 137-50 MCG/ACT SUSP Place 1 spray into the nose 2 (two) times daily as needed (runny or stuffy nose). 09/07/23   Jeneal Danita Macintosh, MD  EPINEPHrine  0.3 mg/0.3 mL IJ SOAJ injection Inject 0.3 mg into the muscle as needed for anaphylaxis. Patient not taking: Reported on 03/23/2024 02/25/23   Jeneal Danita Macintosh, MD  hydrOXYzine  (ATARAX ) 25 MG tablet Take 25 mg by mouth at bedtime as needed. 08/11/23   [provider]  lamoTRIgine  (LAMICTAL ) 25 MG tablet Take 1 tablet (25 mg total) by mouth 2 (two) times daily. 06/11/23   Carrion-Carrero, Marlo, MD  levocetirizine (XYZAL ) 5 MG tablet Take 1 tablet (5 mg total) by mouth daily as needed for allergies. 09/07/23   Jeneal Danita Macintosh, MD  lisdexamfetamine  (VYVANSE ) 40 MG capsule Take 1 capsule (40 mg total) by mouth daily. 06/12/23 03/23/24  Carrion-Carrero, Marlo, MD  Norethindrone Acetate-Ethinyl Estrad-FE (LOESTRIN 24 FE) 1-20 MG-MCG(24) tablet Take 1 tablet by mouth daily.  05/31/24   Nicholaus Burnard HERO, MD  QELBREE 150 MG 24 hr capsule Take 300 mg by mouth every morning. 12/30/23   [provider]    Allergies: Patient has no known allergies.    Review of Systems  Gastrointestinal:  Positive for abdominal pain.  Neurological:  Positive for headaches.    Updated Vital Signs BP (!) 144/97 (BP Location: Right Arm)   Pulse 82   Temp 98 F (36.7 C)   Resp 16   SpO2 100%   Physical Exam Vitals and nursing note reviewed.  Constitutional:      Appearance: She is well-developed. She is obese.  Cardiovascular:     Rate and Rhythm: Normal rate and regular rhythm.     Heart sounds: No murmur heard. Pulmonary:     Effort: Pulmonary effort is normal.     Breath sounds: No wheezing, rhonchi or rales.  Abdominal:     Palpations: Abdomen is soft.     Comments: The abdomen is soft, nondistended. No reproducible tenderness to palpation of the abdomen.  Musculoskeletal:        General: Normal range of motion.  Skin:    General: Skin is warm and dry.  Neurological:     Mental Status: She is alert and oriented to person, place, and time.     (all labs ordered are listed, but only abnormal results are displayed) Labs Reviewed  COMPREHENSIVE METABOLIC PANEL WITH GFR - Abnormal; Notable for the following components:  Result Value   Calcium 10.7 (*)    Total Protein 9.4 (*)    Albumin 5.2 (*)    All other components within normal limits  CBC - Abnormal; Notable for the following components:   Platelets 410 (*)    All other components within normal limits  LIPASE, BLOOD  HCG, SERUM, QUALITATIVE    EKG: None  Radiology: CT ABDOMEN PELVIS W CONTRAST Result Date: 09/18/2024 EXAM: CT ABDOMEN AND PELVIS WITH CONTRAST 09/18/2024 06:14:15 PM TECHNIQUE: CT of the abdomen and pelvis was performed with the administration of intravenous contrast. Multiplanar reformatted images are provided for review. Automated exposure control, iterative reconstruction,  and/or weight-based adjustment of the mA/kV was utilized to reduce the radiation dose to as low as reasonably achievable. COMPARISON: Ultrasound pelvis 02/22/2024. CLINICAL HISTORY: Generalized abdominal pain worse on the left flank for 5 days. Fatigue and headache. FINDINGS: LOWER CHEST: Lung bases are clear. LIVER: Small area of focal fatty infiltration in the left lobe of the liver. GALLBLADDER AND BILE DUCTS: Gallbladder is unremarkable. No biliary ductal dilatation. SPLEEN: No acute abnormality. PANCREAS: No acute abnormality. ADRENAL GLANDS: No acute abnormality. KIDNEYS, URETERS AND BLADDER: No stones in the kidneys or ureters. No hydronephrosis. No perinephric or periureteral stranding. Urinary bladder is unremarkable. GI AND BOWEL: Stomach demonstrates no acute abnormality. There is no bowel obstruction. The appendix is normal. PERITONEUM AND RETROPERITONEUM: No ascites. No free air. VASCULATURE: Aorta is normal in caliber. LYMPH NODES: No lymphadenopathy. REPRODUCTIVE ORGANS: The uterus and ovaries are not enlarged. BONES AND SOFT TISSUES: No acute osseous abnormality. No focal soft tissue abnormality. IMPRESSION: 1. No acute findings in the abdomen or pelvis. Electronically signed by: Elsie Gravely MD 09/18/2024 06:36 PM EST RP Workstation: [id number]     Procedures   Medications Ordered in the ED  fentaNYL  (SUBLIMAZE ) injection 50 mcg (50 mcg Intravenous Given 09/18/24 1750)  iohexol  (OMNIPAQUE ) 300 MG/ML solution 100 mL (100 mLs Intravenous Contrast Given 09/18/24 1759)    Clinical Course as of 09/18/24 1924  Sun Sep 18, 2024  1821 Patient to ED for evaluation of progressively worsening abdominal pain from left mid-abdomen across the top of the abdomen. Not reproducible to light or deep palpation on exam.    [SU]  1922 Labs are reassuring. CT scan negative for acute finding. Vitals remain stable. Patient updated on results and stability for discharge home. Encouraged Tylenol  - discussed  dosing and maximum quantities - and PCP follow up to continue evaluations in the outpatient setting. Also discussed return precautions.  [SU]    Clinical Course User Index [SU] Odell Balls, PA-C                                 Medical Decision Making Amount and/or Complexity of Data Reviewed Labs: ordered. Radiology: ordered.  Risk Prescription drug management.        Final diagnoses:  Abdominal pain, unspecified abdominal location    ED Discharge Orders     None          Odell Balls, PA-C 09/18/24 1924    Ellouise Richerd POUR, DO 09/18/24 1942  "

## 2024-09-18 NOTE — ED Notes (Signed)
 Pt axo4. GCS 15. Pt verbalizes understanding of discharge instructions and follow up. Pt ambulated out of er with steady gait to transportation home with mother

## 2024-09-18 NOTE — Discharge Instructions (Signed)
 As we discussed, there is no cause identified for your abdominal pain on labs, exam or CT scan. You are encouraged to continue the evaluation with a primary care provider of your choice. Recommend Tylenol  1000 mg 4 times daily for pain (maximum 4 doses or 4000 mg in a 24 hour period).   Return to the ED if you develop a high fever, start vomiting a lot, have any bloody stools, or if pain becomes severe.

## 2024-09-18 NOTE — ED Triage Notes (Signed)
 Pt sts generalized abd pain worse on left upper into flank x 5 days; pt sts some fatigue and HA; denies cough or urinary sx
